# Patient Record
Sex: Female | Born: 1970 | Race: Black or African American | Hispanic: No | Marital: Single | Smoking: Current every day smoker
Health system: Southern US, Community
[De-identification: ages and names within clinical notes are randomized; demographics above are authoritative.]

## PROBLEM LIST (undated history)

## (undated) DIAGNOSIS — I10 Essential (primary) hypertension: Secondary | ICD-10-CM

## (undated) DIAGNOSIS — E119 Type 2 diabetes mellitus without complications: Secondary | ICD-10-CM

## (undated) HISTORY — PX: LAMINECTOMY: SHX219

## (undated) HISTORY — PX: ABDOMINAL HYSTERECTOMY: SHX81

## (undated) HISTORY — PX: SPINAL FUSION: SHX223

## (undated) HISTORY — PX: CHOLECYSTECTOMY: SHX55

## (undated) HISTORY — PX: BACK SURGERY: SHX140

---

## 2010-06-12 ENCOUNTER — Encounter: Admission: RE | Admit: 2010-06-12 | Discharge: 2010-06-12 | Payer: Self-pay

## 2010-06-20 ENCOUNTER — Emergency Department (HOSPITAL_COMMUNITY): Admission: EM | Admit: 2010-06-20 | Discharge: 2010-06-20 | Payer: Self-pay | Admitting: Emergency Medicine

## 2010-11-29 LAB — URINALYSIS, ROUTINE W REFLEX MICROSCOPIC
Bilirubin Urine: NEGATIVE
Nitrite: NEGATIVE
Specific Gravity, Urine: 1.025 (ref 1.005–1.030)
pH: 5.5 (ref 5.0–8.0)

## 2010-11-29 LAB — COMPREHENSIVE METABOLIC PANEL
Albumin: 4.2 g/dL (ref 3.5–5.2)
BUN: 12 mg/dL (ref 6–23)
Creatinine, Ser: 0.92 mg/dL (ref 0.4–1.2)
Total Protein: 8 g/dL (ref 6.0–8.3)

## 2010-11-29 LAB — DIFFERENTIAL
Basophils Absolute: 0 10*3/uL (ref 0.0–0.1)
Lymphocytes Relative: 23 % (ref 12–46)
Monocytes Absolute: 0.6 10*3/uL (ref 0.1–1.0)
Monocytes Relative: 4 % (ref 3–12)
Neutro Abs: 11.7 10*3/uL — ABNORMAL HIGH (ref 1.7–7.7)

## 2010-11-29 LAB — URINE MICROSCOPIC-ADD ON

## 2010-11-29 LAB — CBC
MCH: 30.6 pg (ref 26.0–34.0)
MCHC: 35 g/dL (ref 30.0–36.0)
MCV: 87.3 fL (ref 78.0–100.0)
Platelets: 237 10*3/uL (ref 150–400)
RDW: 13.3 % (ref 11.5–15.5)
WBC: 16.1 10*3/uL — ABNORMAL HIGH (ref 4.0–10.5)

## 2011-10-03 IMAGING — CR DG ABDOMEN 2V
2 series · 2 of 2 positions shown · non-contrast
Comparison: None.

CLINICAL DATA: 39-year-old female with abdominal pain, nausea,
vomiting, left chest pain.

ABDOMEN - 2 VIEW

[w abdomen upright]
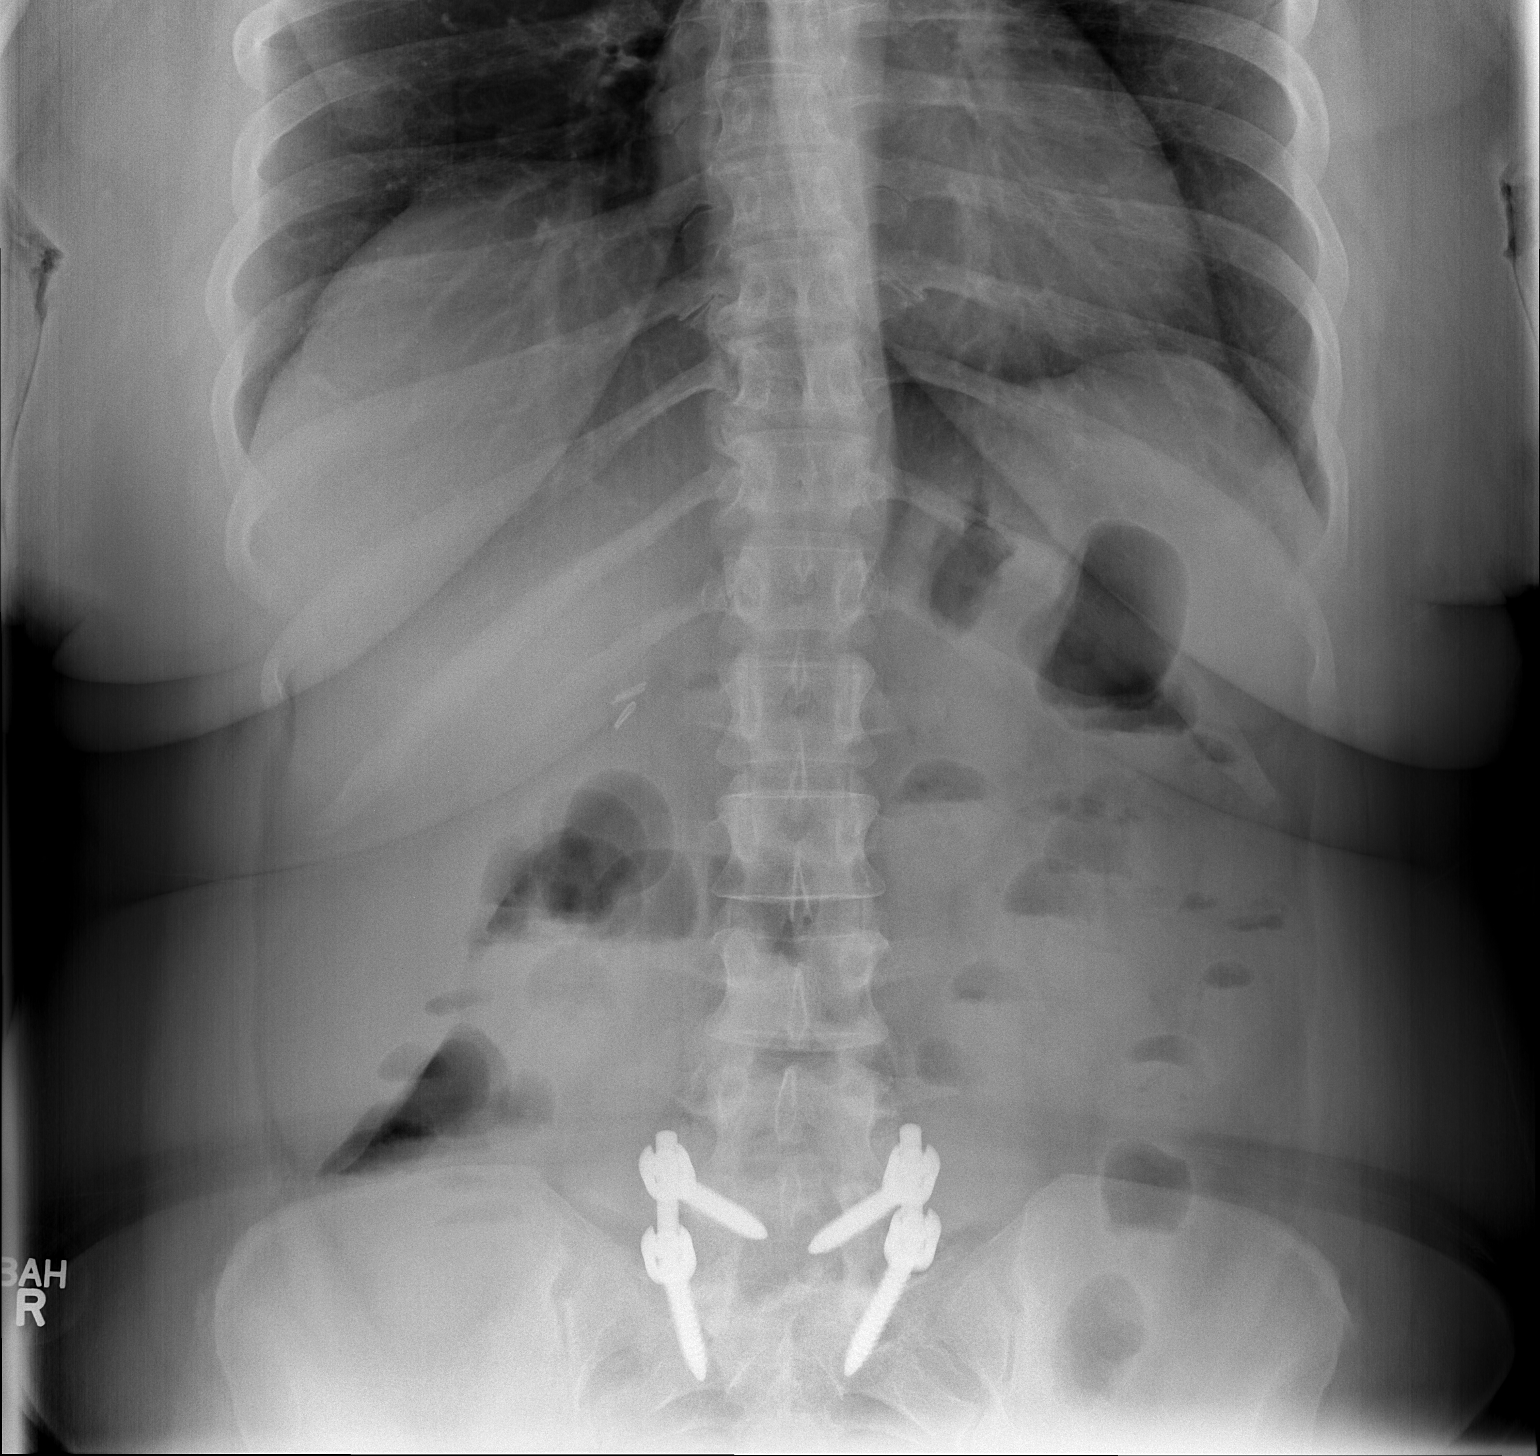

[t abdomen supine]
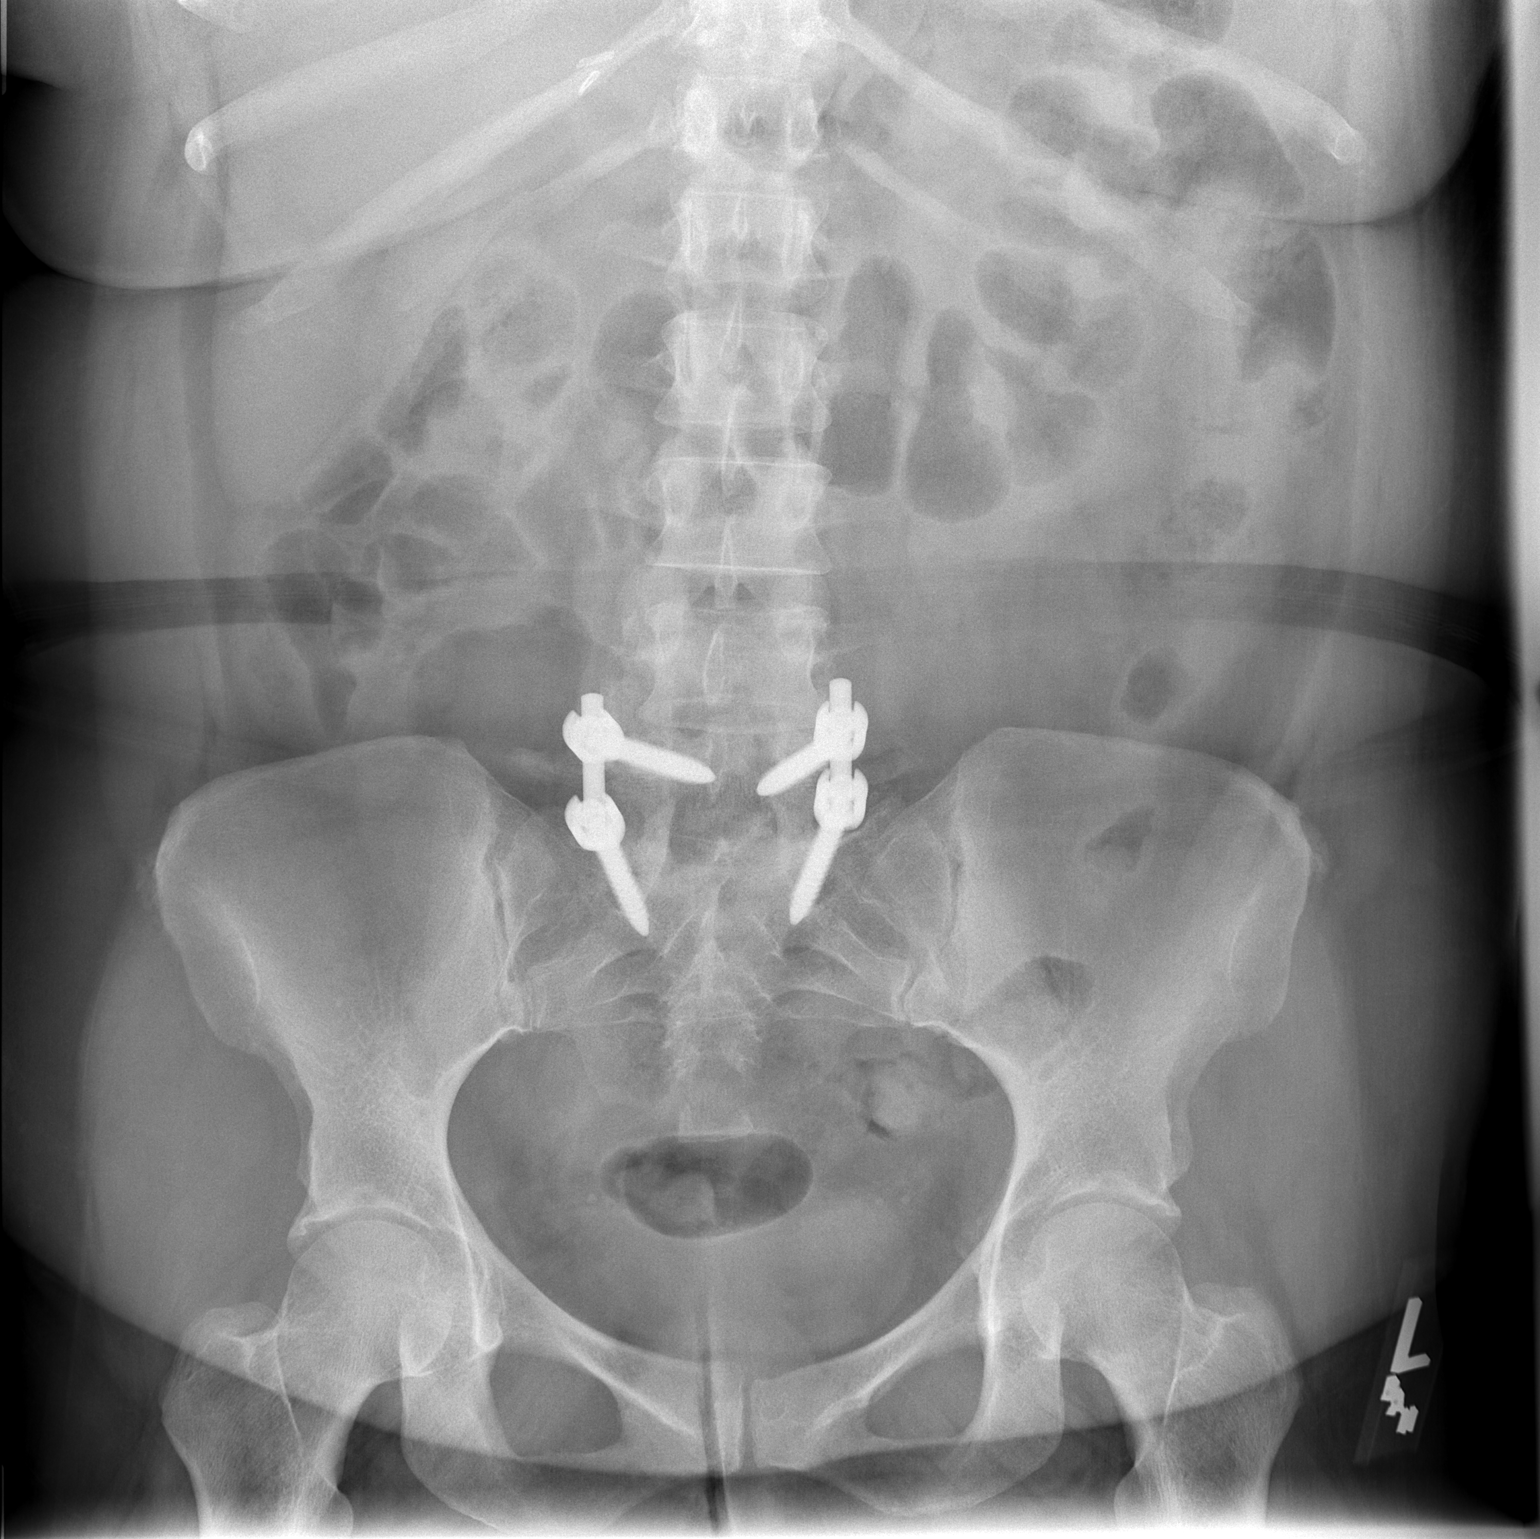

[2 of 2 positions shown; findings below may reference images not displayed]

FINDINGS: Mild elevation of the right hemidiaphragm.  Otherwise,
visualized lung bases are clear.  No pneumoperitoneum.  Surgical
clips in the right upper quadrant.  Sequelae of lumbosacral fusion.
Nonobstructed bowel gas pattern. Abdominal and pelvic visceral
contours are within normal limits. No acute osseous abnormality
identified.
IMPRESSION: Nonobstructed bowel gas pattern, no free air.

## 2012-11-13 ENCOUNTER — Other Ambulatory Visit: Payer: Self-pay | Admitting: Orthopedic Surgery

## 2012-11-13 DIAGNOSIS — M545 Low back pain: Secondary | ICD-10-CM

## 2012-11-18 ENCOUNTER — Ambulatory Visit
Admission: RE | Admit: 2012-11-18 | Discharge: 2012-11-18 | Disposition: A | Discharge status: Home / Self Care | Payer: Medicare Other | Source: Ambulatory Visit | Attending: Orthopedic Surgery | Admitting: Orthopedic Surgery

## 2012-11-18 DIAGNOSIS — M545 Low back pain: Secondary | ICD-10-CM

## 2013-12-21 ENCOUNTER — Other Ambulatory Visit: Payer: Self-pay | Admitting: Orthopedic Surgery

## 2013-12-21 DIAGNOSIS — M542 Cervicalgia: Secondary | ICD-10-CM

## 2014-01-10 ENCOUNTER — Ambulatory Visit
Admission: RE | Admit: 2014-01-10 | Discharge: 2014-01-10 | Disposition: A | Discharge status: Home / Self Care | Payer: Medicare Other | Source: Ambulatory Visit | Attending: Orthopedic Surgery | Admitting: Orthopedic Surgery

## 2014-01-10 DIAGNOSIS — M542 Cervicalgia: Secondary | ICD-10-CM

## 2014-03-11 ENCOUNTER — Other Ambulatory Visit: Payer: Self-pay | Admitting: Pain Medicine

## 2014-03-11 DIAGNOSIS — M545 Low back pain, unspecified: Secondary | ICD-10-CM

## 2014-03-14 ENCOUNTER — Ambulatory Visit
Admission: RE | Admit: 2014-03-14 | Discharge: 2014-03-14 | Disposition: A | Discharge status: Home / Self Care | Payer: Medicare Other | Source: Ambulatory Visit | Attending: Pain Medicine | Admitting: Pain Medicine

## 2014-03-14 DIAGNOSIS — M545 Low back pain, unspecified: Secondary | ICD-10-CM

## 2014-05-07 ENCOUNTER — Encounter (HOSPITAL_COMMUNITY): Payer: Self-pay | Admitting: Emergency Medicine

## 2014-05-07 ENCOUNTER — Emergency Department (HOSPITAL_COMMUNITY)
Admission: EM | Admit: 2014-05-07 | Discharge: 2014-05-07 | Disposition: A | Discharge status: Home / Self Care | Payer: Medicare Other | Attending: Emergency Medicine | Admitting: Emergency Medicine

## 2014-05-07 ENCOUNTER — Emergency Department (HOSPITAL_COMMUNITY): Payer: Medicare Other

## 2014-05-07 DIAGNOSIS — Z79899 Other long term (current) drug therapy: Secondary | ICD-10-CM | POA: Diagnosis not present

## 2014-05-07 DIAGNOSIS — M542 Cervicalgia: Secondary | ICD-10-CM | POA: Insufficient documentation

## 2014-05-07 DIAGNOSIS — R51 Headache: Secondary | ICD-10-CM | POA: Insufficient documentation

## 2014-05-07 DIAGNOSIS — E119 Type 2 diabetes mellitus without complications: Secondary | ICD-10-CM | POA: Diagnosis not present

## 2014-05-07 DIAGNOSIS — R519 Headache, unspecified: Secondary | ICD-10-CM

## 2014-05-07 DIAGNOSIS — F17200 Nicotine dependence, unspecified, uncomplicated: Secondary | ICD-10-CM | POA: Diagnosis not present

## 2014-05-07 DIAGNOSIS — G8929 Other chronic pain: Secondary | ICD-10-CM | POA: Insufficient documentation

## 2014-05-07 DIAGNOSIS — I10 Essential (primary) hypertension: Secondary | ICD-10-CM | POA: Diagnosis not present

## 2014-05-07 HISTORY — DX: Type 2 diabetes mellitus without complications: E11.9

## 2014-05-07 HISTORY — DX: Essential (primary) hypertension: I10

## 2014-05-07 LAB — CBC
HEMATOCRIT: 43 % (ref 36.0–46.0)
Hemoglobin: 14.7 g/dL (ref 12.0–15.0)
MCH: 30 pg (ref 26.0–34.0)
MCHC: 34.2 g/dL (ref 30.0–36.0)
MCV: 87.8 fL (ref 78.0–100.0)
PLATELETS: 245 10*3/uL (ref 150–400)
RBC: 4.9 MIL/uL (ref 3.87–5.11)
RDW: 12.4 % (ref 11.5–15.5)
WBC: 18.3 10*3/uL — ABNORMAL HIGH (ref 4.0–10.5)

## 2014-05-07 LAB — COMPREHENSIVE METABOLIC PANEL
ALBUMIN: 4 g/dL (ref 3.5–5.2)
ALK PHOS: 75 U/L (ref 39–117)
ALT: 11 U/L (ref 0–35)
ANION GAP: 16 — AB (ref 5–15)
AST: 14 U/L (ref 0–37)
BUN: 16 mg/dL (ref 6–23)
CO2: 24 mEq/L (ref 19–32)
CREATININE: 0.79 mg/dL (ref 0.50–1.10)
Calcium: 10.6 mg/dL — ABNORMAL HIGH (ref 8.4–10.5)
Chloride: 98 mEq/L (ref 96–112)
GFR calc Af Amer: >90 mL/min (ref >90)
GFR calc non Af Amer: >90 mL/min (ref >90)
Glucose, Bld: 89 mg/dL (ref 70–99)
POTASSIUM: 3.8 meq/L (ref 3.7–5.3)
Sodium: 138 mEq/L (ref 137–147)
TOTAL PROTEIN: 8.3 g/dL (ref 6.0–8.3)
Total Bilirubin: 0.2 mg/dL — ABNORMAL LOW (ref 0.3–1.2)

## 2014-05-07 MED ORDER — HYDROCODONE-ACETAMINOPHEN 5-325 MG PO TABS: 2.0000 | ORAL_TABLET | Freq: Four times a day (QID) | ORAL | Status: AC | PRN

## 2014-05-07 MED ORDER — HYDROCODONE-ACETAMINOPHEN 5-325 MG PO TABS
2.0000 | ORAL_TABLET | Freq: Once | ORAL | Status: AC
  Administered 2014-05-07: 2 via ORAL
  Filled 2014-05-07: qty 2

## 2014-05-07 MED ORDER — SODIUM CHLORIDE 0.9 % IV BOLUS (SEPSIS)
1000.0000 mL | Freq: Once | INTRAVENOUS | Status: AC
  Administered 2014-05-07: 1000 mL via INTRAVENOUS

## 2014-05-07 MED ORDER — DIAZEPAM 5 MG PO TABS: 5.0000 mg | ORAL_TABLET | Freq: Three times a day (TID) | ORAL | Status: AC | PRN

## 2014-05-07 MED ORDER — DIAZEPAM 5 MG PO TABS
5.0000 mg | ORAL_TABLET | Freq: Once | ORAL | Status: AC
  Administered 2014-05-07: 5 mg via ORAL
  Filled 2014-05-07: qty 1

## 2014-05-07 NOTE — ED Notes (Signed)
Pt states she has a hx of HTN she does take lisinopril and amlodipine w/o reduction in BP.  Pt has chronic neck pain which she believes is contributing to the elevation in her BP.

## 2014-05-07 NOTE — Discharge Instructions (Signed)

## 2014-05-08 NOTE — ED Provider Notes (Signed)
CSN: [id number]     Arrival date & time 05/07/14  1923 History   First MD Initiated Contact with Patient 05/07/14 2006     No chief complaint on file.    (Consider location/radiation/quality/duration/timing/severity/associated sxs/prior Treatment) Patient is a 43 y.o. female presenting with headaches. The history is provided by the patient.  Headache Pain location:  Frontal and occipital Quality:  Dull Radiates to:  Does not radiate Severity currently:  7/10 Onset quality:  Gradual Duration:  3 days Timing:  Intermittent Progression:  Waxing and waning Chronicity:  New Similar to prior headaches: no   Context: not activity, not exposure to bright light and not loud noise   Relieved by:  Nothing Worsened by:  Nothing tried Associated symptoms: blurred vision (very mild, occasional)   Associated symptoms: no abdominal pain, no cough, no pain, no fever, no neck stiffness, no numbness and no vomiting     Past Medical History  Diagnosis Date  . Hypertension   . Diabetes mellitus without complication    Past Surgical History  Procedure Laterality Date  . Spinal fusion      x 2  . Back surgery    . Laminectomy    . Cholecystectomy    . Abdominal hysterectomy     History reviewed. No pertinent family history. History  Substance Use Topics  . Smoking status: Current Every Day Smoker  . Smokeless tobacco: Not on file  . Alcohol Use: Yes   OB History   Grav Para Term Preterm Abortions TAB SAB Ect Mult Living                 Review of Systems  Constitutional: Negative for fever.  Eyes: Positive for blurred vision (very mild, occasional). Negative for pain.  Respiratory: Negative for cough and shortness of breath.   Cardiovascular: Negative for leg swelling.  Gastrointestinal: Negative for vomiting and abdominal pain.  Musculoskeletal: Negative for neck stiffness.  Neurological: Positive for headaches. Negative for numbness.  All other systems reviewed and are  negative.     Allergies  Fish allergy  Home Medications   Prior to Admission medications   Medication Sig Start Date End Date Taking? Authorizing Provider  albuterol (PROVENTIL HFA;VENTOLIN HFA) 108 (90 BASE) MCG/ACT inhaler Inhale into the lungs every 6 (six) hours as needed for wheezing or shortness of breath.   Yes Historical Provider, MD  amLODipine (NORVASC) 5 MG tablet Take 5 mg by mouth daily.   Yes Historical Provider, MD  aspirin 325 MG tablet Take 650 mg by mouth every 6 (six) hours as needed for moderate pain.   Yes Historical Provider, MD  gabapentin (NEURONTIN) 600 MG tablet Take 1,200 mg by mouth 2 (two) times daily.   Yes Historical Provider, MD  lisinopril-hydrochlorothiazide (PRINZIDE,ZESTORETIC) 20-25 MG per tablet Take 1 tablet by mouth every morning.   Yes Historical Provider, MD  metFORMIN (GLUCOPHAGE) 500 MG tablet Take 500 mg by mouth 3 (three) times daily.   Yes Historical Provider, MD  diazepam (VALIUM) 5 MG tablet Take 1 tablet (5 mg total) by mouth every 8 (eight) hours as needed for muscle spasms. 05/07/14   Elwin Mocha, MD  HYDROcodone-acetaminophen (NORCO/VICODIN) 5-325 MG per tablet Take 2 tablets by mouth every 6 (six) hours as needed for moderate pain. 05/07/14   Elwin Mocha, MD   BP 147/72  Pulse 70  Temp(Src) 98.4 F (36.9 C) (Oral)  Resp 18  SpO2 96% Physical Exam  Nursing note and vitals reviewed. Constitutional: She  is oriented to person, place, and time. She appears well-developed and well-nourished. No distress.  HENT:  Head: Normocephalic and atraumatic.  Mouth/Throat: Oropharynx is clear and moist.  Eyes: EOM are normal. Pupils are equal, round, and reactive to light.  Neck: Normal range of motion. Neck supple.  Cardiovascular: Normal rate and regular rhythm.  Exam reveals no friction rub.   No murmur heard. Pulmonary/Chest: Effort normal and breath sounds normal. No respiratory distress. She has no wheezes. She has no rales.  Abdominal:  Soft. She exhibits no distension. There is no tenderness. There is no rebound.  Musculoskeletal: Normal range of motion. She exhibits no edema.       Cervical back: She exhibits tenderness (musculature) and bony tenderness (neck).  Neurological: She is alert and oriented to person, place, and time. She exhibits normal muscle tone.  Skin: No rash noted. She is not diaphoretic.    ED Course  Procedures (including critical care time) Labs Review Labs Reviewed  CBC - Abnormal; Notable for the following:    WBC 18.3 (*)    All other components within normal limits  COMPREHENSIVE METABOLIC PANEL - Abnormal; Notable for the following:    Calcium 10.6 (*)    Total Bilirubin 0.2 (*)    Anion gap 16 (*)    All other components within normal limits    Imaging Review Ct Head Wo Contrast  05/07/2014   CLINICAL DATA:  Headache and hypertension  EXAM: CT HEAD WITHOUT CONTRAST  TECHNIQUE: Contiguous axial images were obtained from the base of the skull through the vertex without intravenous contrast.  COMPARISON:  None.  FINDINGS: The ventricles are normal in size and configuration. There is no mass, hemorrhage, extra-axial fluid collection, or midline shift. Gray-white compartments are normal. No demonstrable acute infarct. Bony calvarium appears intact. The mastoid air cells are clear.  IMPRESSION: Study within normal limits.   Electronically Signed   By: Bretta Bang M.D.   On: 05/07/2014 21:21     EKG Interpretation None      MDM   Final diagnoses:  Acute nonintractable headache, unspecified headache type  Neck pain, chronic    52F here with chronic neck pain - having headaches for past few days. No fevers. States occasional blurry vision, no vomiting or diarrhea. No SOB or CP. Abdomen benign. BPs elevated at home 180s/120s - concern HTN causing her headaches. Severe upper back musculature and neck pain. BP lower here, much improved with diastolics <100. CT and labs ok. Feeling better  after pain meds. Stable for discharge. Instructed to f/u with PCP for BP control.    Elwin Mocha, MD 05/08/14 [phone]

## 2014-11-15 ENCOUNTER — Other Ambulatory Visit: Payer: Self-pay | Admitting: Family Medicine

## 2014-11-15 ENCOUNTER — Other Ambulatory Visit (HOSPITAL_COMMUNITY)
Admission: RE | Admit: 2014-11-15 | Discharge: 2014-11-15 | Disposition: A | Discharge status: Home / Self Care | Payer: Medicare Other | Source: Ambulatory Visit | Attending: Family Medicine | Admitting: Family Medicine

## 2014-11-15 DIAGNOSIS — Z124 Encounter for screening for malignant neoplasm of cervix: Secondary | ICD-10-CM | POA: Diagnosis present

## 2014-11-15 DIAGNOSIS — Z1151 Encounter for screening for human papillomavirus (HPV): Secondary | ICD-10-CM | POA: Diagnosis present

## 2014-11-15 DIAGNOSIS — Z113 Encounter for screening for infections with a predominantly sexual mode of transmission: Secondary | ICD-10-CM | POA: Insufficient documentation

## 2014-11-16 LAB — CYTOLOGY - PAP

## 2015-08-20 IMAGING — CT CT HEAD W/O CM
2 series · 16 of 30 positions shown, 20 images · non-contrast
Comparison: None.

CLINICAL DATA: Headache and hypertension

EXAM:
CT HEAD WITHOUT CONTRAST
TECHNIQUE: Contiguous axial images were obtained from the base of the skull
through the vertex without intravenous contrast.

[Series 2: head w/o · axial · non-contrast · 0.47mm/px · z∈[+1462,+1577]mm · 13 of 27 slices shown, 17 images]
[im 2/27  brain]
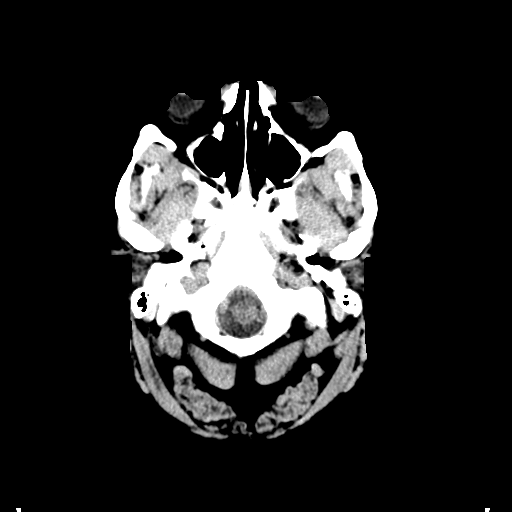
[im 2/27  bone]
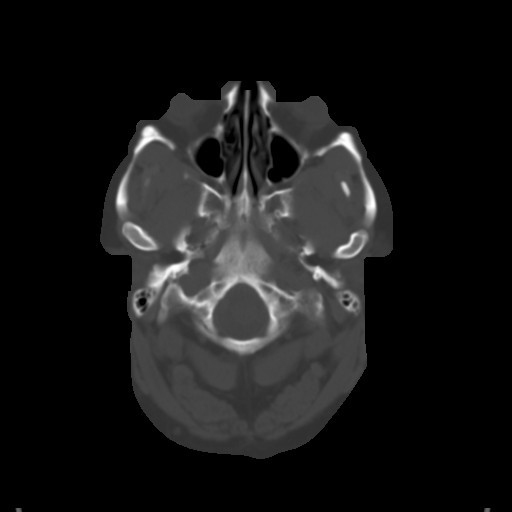
[im 4/27  brain]
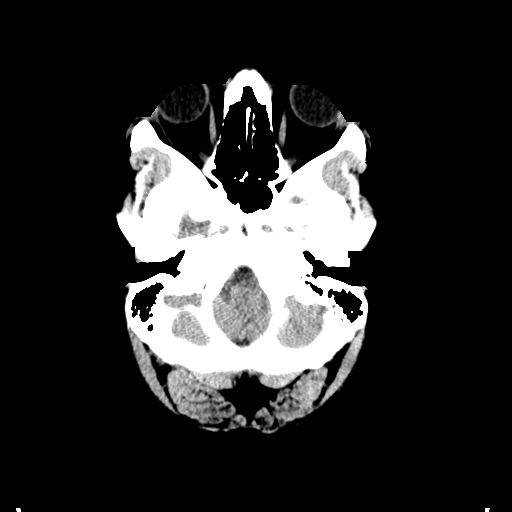
[im 6/27  brain]
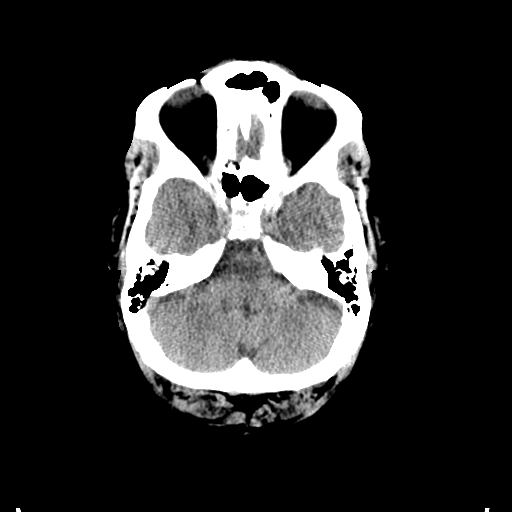
[im 8/27  brain]
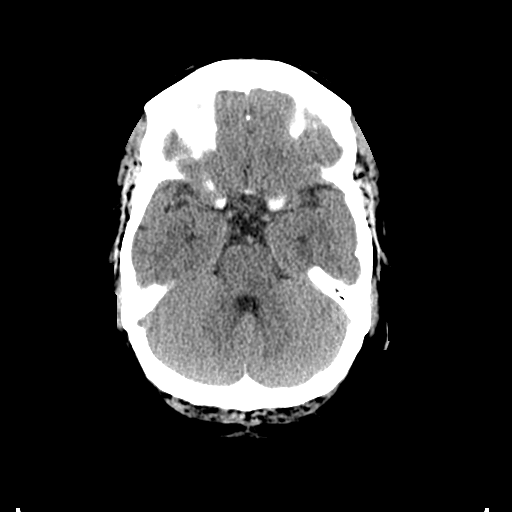
[im 10/27  brain]
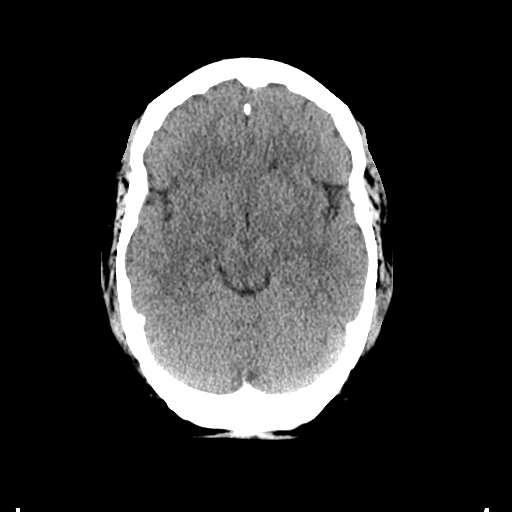
[im 10/27  bone]
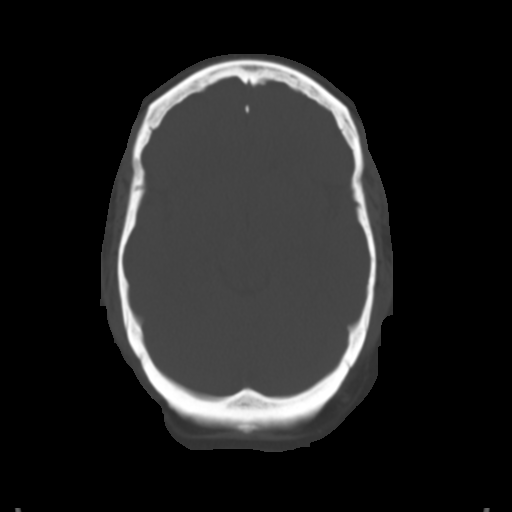
[im 12/27  brain]
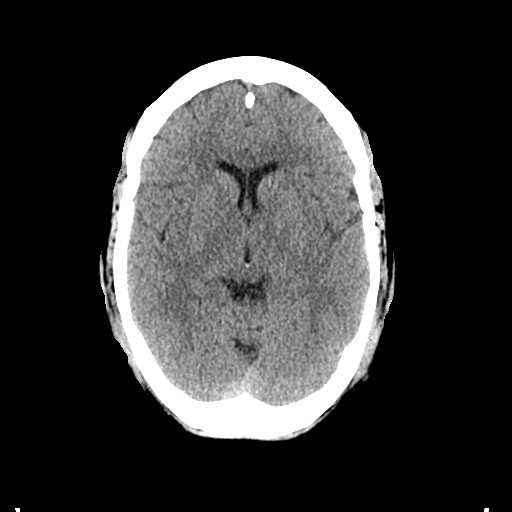
[im 14/27  brain]
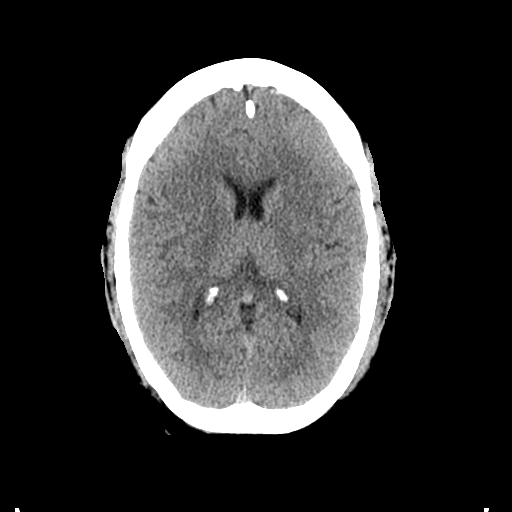
[im 15/27  brain]
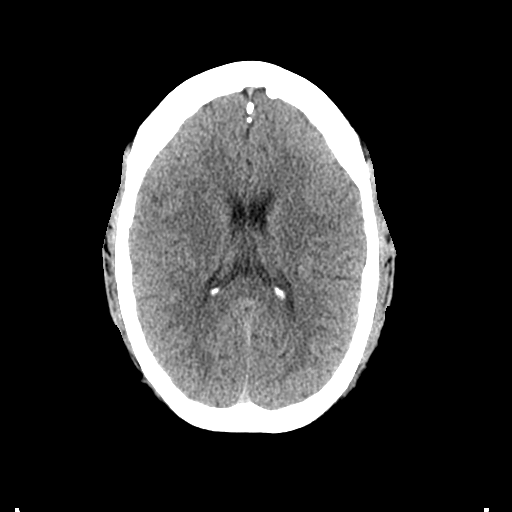
[im 17/27  brain]
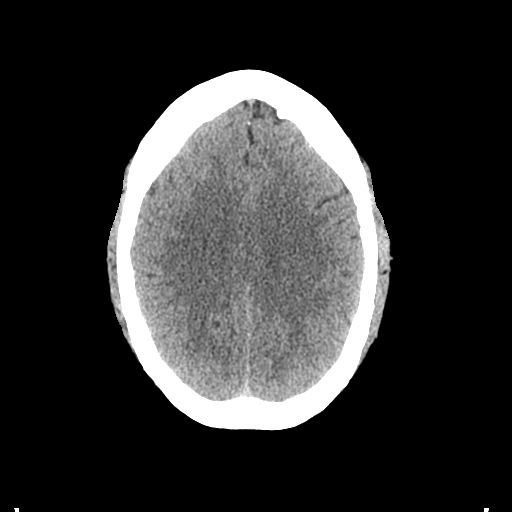
[im 17/27  bone]
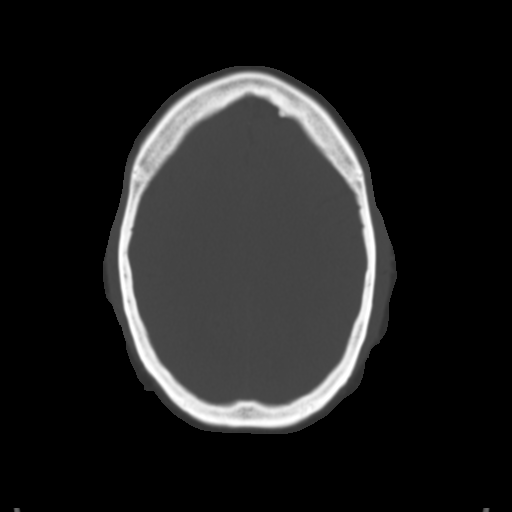
[im 19/27  brain]
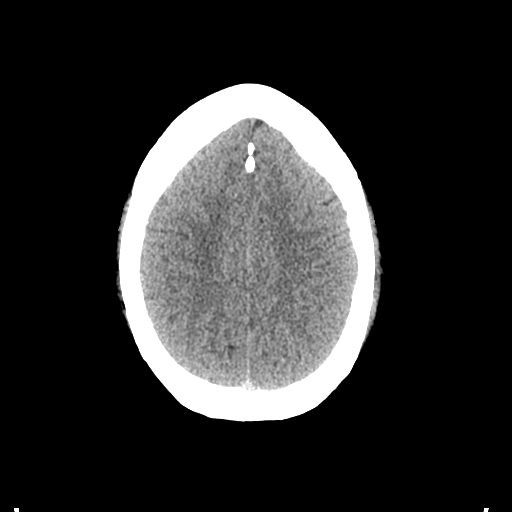
[im 21/27  brain]
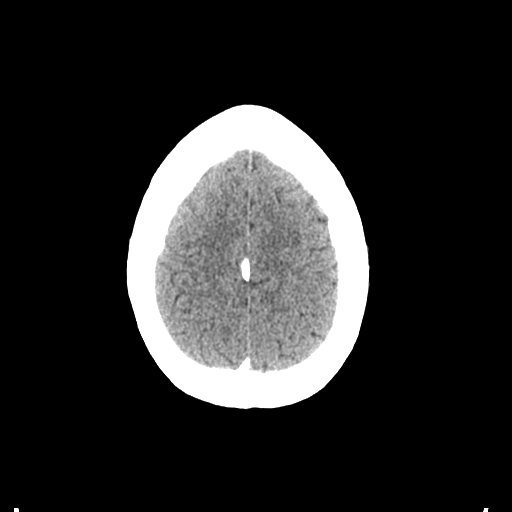
[im 23/27  brain]
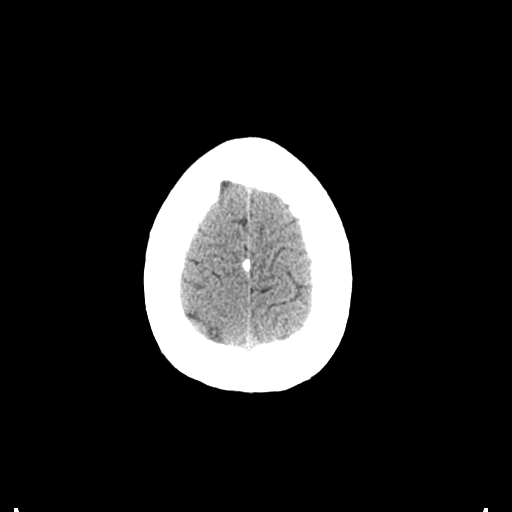
[im 25/27  brain]
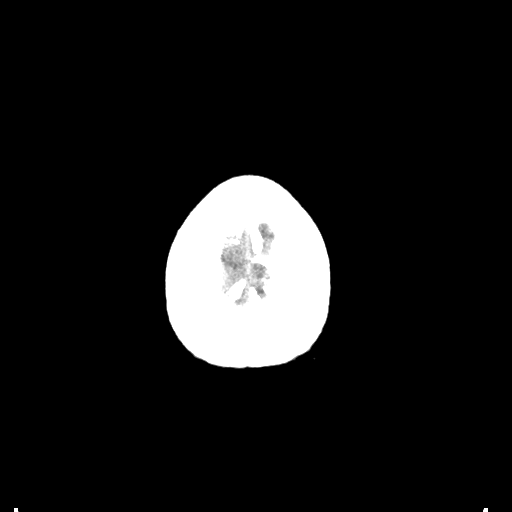
[im 25/27  bone]
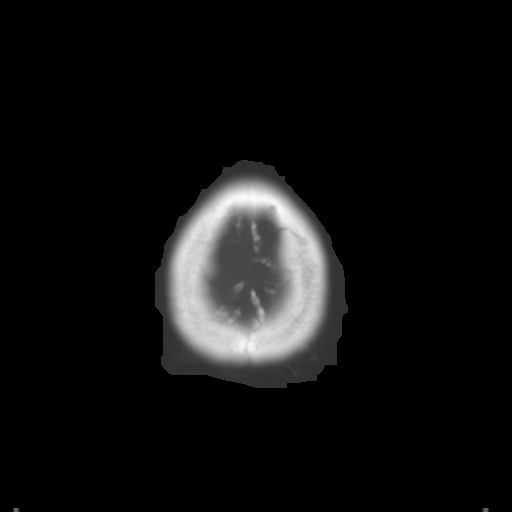

[Series 3: bone windows · axial · 0.47mm/px · z∈[+1462,+1502]mm · 3 of 27 slices shown]
[im 2/27  bone]
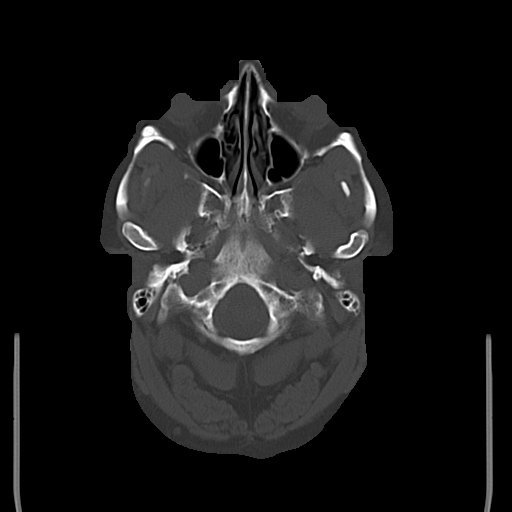
[im 6/27  bone]
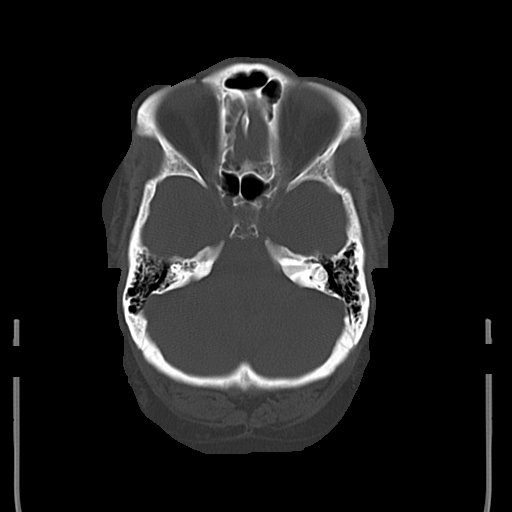
[im 10/27  bone]
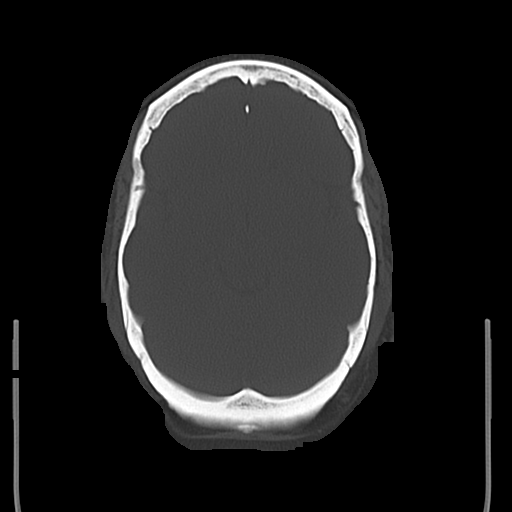

[16 of 30 positions shown; findings below may reference images not displayed]

FINDINGS: The ventricles are normal in size and configuration. There is no
mass, hemorrhage, extra-axial fluid collection, or midline shift.
Gray-white compartments are normal. No demonstrable acute infarct.
Bony calvarium appears intact. The mastoid air cells are clear.
IMPRESSION: Study within normal limits.
